# Patient Record
Sex: Female | Born: 2000 | Hispanic: Yes | Marital: Single | Smoking: Never smoker
Health system: Southern US, Community
[De-identification: ages and names within clinical notes are randomized; demographics above are authoritative.]

## PROBLEM LIST (undated history)

## (undated) DIAGNOSIS — Q211 Atrial septal defect, unspecified: Secondary | ICD-10-CM

## (undated) DIAGNOSIS — R011 Cardiac murmur, unspecified: Secondary | ICD-10-CM

## (undated) HISTORY — PX: OTHER SURGICAL HISTORY: SHX169

## (undated) HISTORY — DX: Cardiac murmur, unspecified: R01.1

## (undated) HISTORY — PX: ATRIAL SEPTAL DEFECT(ASD) CLOSURE: CATH118299

---
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
## 2019-01-15 ENCOUNTER — Ambulatory Visit (INDEPENDENT_AMBULATORY_CARE_PROVIDER_SITE_OTHER): Payer: Medicaid Other | Admitting: *Deleted

## 2019-01-15 ENCOUNTER — Other Ambulatory Visit: Payer: Self-pay

## 2019-01-15 DIAGNOSIS — Z3042 Encounter for surveillance of injectable contraceptive: Secondary | ICD-10-CM

## 2019-01-15 MED ORDER — MEDROXYPROGESTERONE ACETATE 150 MG/ML IM SUSY
150.0000 mg | PREFILLED_SYRINGE | Freq: Once | INTRAMUSCULAR | Status: AC
  Administered 2019-01-15: 150 mg via INTRAMUSCULAR

## 2019-01-15 NOTE — Progress Notes (Signed)
Patient here today for Depo Provera injection.  Depo given today LUOQ.  Site unremarkable & patient tolerated injection.  Next injection due 04/02/19 - 04/16/19.  Last contraceptive appt: 04/2018  Reminder card given.  Jone Baseman, CMA

## 2019-04-02 ENCOUNTER — Other Ambulatory Visit: Payer: Self-pay

## 2019-04-02 ENCOUNTER — Ambulatory Visit (INDEPENDENT_AMBULATORY_CARE_PROVIDER_SITE_OTHER): Payer: Medicaid Other

## 2019-04-02 DIAGNOSIS — Z30019 Encounter for initial prescription of contraceptives, unspecified: Secondary | ICD-10-CM

## 2019-04-02 MED ORDER — MEDROXYPROGESTERONE ACETATE 150 MG/ML IM SUSP
150.0000 mg | Freq: Once | INTRAMUSCULAR | Status: AC
  Administered 2019-04-02: 150 mg via INTRAMUSCULAR

## 2019-04-02 NOTE — Progress Notes (Signed)
Patient presents in nurse clinic for depo provera injection. Patient is within dates. Depo provera injection given RUOQ, site unremarkable. Next depo due 8/27-9/10, reminder card given.

## 2019-06-03 ENCOUNTER — Other Ambulatory Visit: Payer: Self-pay

## 2019-06-03 MED ORDER — FERROUS SULFATE 325 (65 FE) MG PO TABS: 325.0000 mg | ORAL_TABLET | ORAL | 2 refills | Status: DC

## 2019-07-02 ENCOUNTER — Other Ambulatory Visit: Payer: Self-pay

## 2019-07-02 ENCOUNTER — Ambulatory Visit: Payer: Medicaid Other

## 2019-07-02 DIAGNOSIS — Z3042 Encounter for surveillance of injectable contraceptive: Secondary | ICD-10-CM

## 2019-07-02 MED ORDER — MEDROXYPROGESTERONE ACETATE 150 MG/ML IM SUSP
150.0000 mg | Freq: Once | INTRAMUSCULAR | Status: AC
  Administered 2021-01-23: 150 mg via INTRAMUSCULAR

## 2019-07-02 NOTE — Progress Notes (Signed)
Patient here today for Depo Provera injection and is within her dates.    Last contraceptive appt was 04/02/2019.   Depo given in LOQ today.  Site unremarkable & patient tolerated injection.    Next injection due Nov 26-Dec10.  Reminder card given.    Ottis Stain, CMA

## 2019-09-23 ENCOUNTER — Other Ambulatory Visit: Payer: Self-pay

## 2019-09-23 ENCOUNTER — Ambulatory Visit (INDEPENDENT_AMBULATORY_CARE_PROVIDER_SITE_OTHER): Payer: Medicaid Other | Admitting: *Deleted

## 2019-09-23 DIAGNOSIS — Z3042 Encounter for surveillance of injectable contraceptive: Secondary | ICD-10-CM | POA: Diagnosis not present

## 2019-09-24 DIAGNOSIS — Z3042 Encounter for surveillance of injectable contraceptive: Secondary | ICD-10-CM

## 2019-09-24 MED ORDER — MEDROXYPROGESTERONE ACETATE 150 MG/ML IM SUSY
150.0000 mg | PREFILLED_SYRINGE | Freq: Once | INTRAMUSCULAR | Status: AC
  Administered 2019-09-24: 150 mg via INTRAMUSCULAR

## 2019-09-24 NOTE — Progress Notes (Signed)
Patient here today for Depo Provera injection and is within her dates.    Last contraceptive appt was 05/14/18.  She was informed that next appt must be with a provider, she is agreeable to plan.  Depo given in [place] today.  Site unremarkable & patient tolerated injection.    Next injection due Feb 17 - December 23, 2019, she will make an appt with the provider within those dates.  Reminder card given.    Christen Bame, CMA

## 2019-12-09 ENCOUNTER — Other Ambulatory Visit: Payer: Self-pay

## 2019-12-09 ENCOUNTER — Ambulatory Visit (INDEPENDENT_AMBULATORY_CARE_PROVIDER_SITE_OTHER): Payer: Medicaid Other | Admitting: Family Medicine

## 2019-12-09 ENCOUNTER — Encounter: Payer: Self-pay | Admitting: Family Medicine

## 2019-12-09 ENCOUNTER — Ambulatory Visit: Payer: Medicaid Other

## 2019-12-09 VITALS — BP 116/64 | HR 88 | Wt 126.0 lb

## 2019-12-09 DIAGNOSIS — Z3042 Encounter for surveillance of injectable contraceptive: Secondary | ICD-10-CM

## 2019-12-09 MED ORDER — MEDROXYPROGESTERONE ACETATE 150 MG/ML IM SUSY
150.0000 mg | PREFILLED_SYRINGE | INTRAMUSCULAR | Status: AC
  Administered 2019-12-09 – 2020-03-02 (×2): 150 mg via INTRAMUSCULAR

## 2019-12-11 NOTE — Progress Notes (Signed)
   CHIEF COMPLAINT / HPI:  Due for Depo shot  PERTINENT  PMH / PSH: Patient does have history of atrial septal defect closure but has been doing well since.  Has been regular with her Depo shots and is within the timeframe for her next dose.  She wishes to continue with this as her contraception plan, says she is safe at home and no one is making her do anything that she does not want to do.  She denies need for testing for STI and has no physical complaints at this time.  Says she does not smoke or drink and has good exercise tolerance, constantly states she could defeat me in a race up 6 flights of stairs in the hospital.   OBJECTIVE: BP 116/64   Pulse 88   Wt 126 lb (57.2 kg)   SpO2 100%   BMI 21.63 kg/m   Physical Exam  Constitutional: She appears well-developed and well-nourished. No distress.  HENT:  Head: Normocephalic.  Eyes: Conjunctivae are normal. Right eye exhibits no discharge. Left eye exhibits no discharge.  Cardiovascular: Normal rate.  Respiratory: Effort normal. No stridor. No respiratory distress.  Genitourinary:    Genitourinary Comments: No indication for sensitive exam at this visit   Neurological: She is alert.  Skin: Skin is warm and dry. She is not diaphoretic.  Psychiatric: She has a normal mood and affect. Her behavior is normal. Judgment and thought content normal.     ASSESSMENT / PLAN:  Encounter for Depo-Provera contraception Patient within timeframe for next dose of Depo, no concerns at this point.  Can continue plan.     Marthenia Rolling, DO [facility]

## 2019-12-11 NOTE — Assessment & Plan Note (Signed)
Patient within timeframe for next dose of Depo, no concerns at this point.  Can continue plan.

## 2020-03-02 ENCOUNTER — Other Ambulatory Visit: Payer: Self-pay

## 2020-03-02 ENCOUNTER — Ambulatory Visit (INDEPENDENT_AMBULATORY_CARE_PROVIDER_SITE_OTHER): Payer: Medicaid Other

## 2020-03-02 DIAGNOSIS — Z3042 Encounter for surveillance of injectable contraceptive: Secondary | ICD-10-CM

## 2020-03-02 NOTE — Progress Notes (Signed)
Patient here today for Depo Provera injection and is within her dates.    Last contraceptive appt was 12/09/2019  Depo given in RUOQ today.  Site unremarkable & patient tolerated injection.    Next injection due 7/28-8/11/21.  Reminder card given.    Veronda Prude, RN

## 2020-03-03 MED ORDER — FERROUS SULFATE 325 (65 FE) MG PO TABS: 325.0000 mg | ORAL_TABLET | ORAL | 2 refills | Status: AC

## 2020-05-16 ENCOUNTER — Ambulatory Visit: Payer: Medicaid Other | Admitting: Family Medicine

## 2020-05-17 ENCOUNTER — Other Ambulatory Visit: Payer: Self-pay

## 2020-05-17 ENCOUNTER — Encounter: Payer: Self-pay | Admitting: Family Medicine

## 2020-05-17 ENCOUNTER — Ambulatory Visit (INDEPENDENT_AMBULATORY_CARE_PROVIDER_SITE_OTHER): Payer: Medicaid Other | Admitting: Family Medicine

## 2020-05-17 VITALS — BP 100/62 | HR 84 | Wt 125.4 lb

## 2020-05-17 DIAGNOSIS — D509 Iron deficiency anemia, unspecified: Secondary | ICD-10-CM | POA: Diagnosis not present

## 2020-05-17 DIAGNOSIS — Z9889 Other specified postprocedural states: Secondary | ICD-10-CM | POA: Diagnosis not present

## 2020-05-17 DIAGNOSIS — D508 Other iron deficiency anemias: Secondary | ICD-10-CM

## 2020-05-17 NOTE — Assessment & Plan Note (Signed)
-   Obtain CBC and Ferritin levels

## 2020-05-17 NOTE — Progress Notes (Signed)
    SUBJECTIVE:   CHIEF COMPLAINT / HPI: Labs  Cynthia Riley is a 19 year old female presenting for Depo shot and Labs.  Was informed by nurse that se would have to wait until tomorrow for Depo shot.  Indicates she still wished to obtain labs today as she was already here.  Has previous history of iron deficiency anemia.  Denies any heavy menstruation, fatigue, weakness or presyncope.  Currently takes Iron supplement every other day.  Also had Surgical closure of patent atrial septum in 2017.    PERTINENT  PMH / PSH: Iron deficiency Anemia  OBJECTIVE:   BP (!) 100/62   Pulse 84   Wt 125 lb 6 oz (56.9 kg)   SpO2 99%   BMI 21.52 kg/m    Physical Exam Constitutional:      General: She is not in acute distress.    Appearance: Normal appearance.  HENT:     Head: Normocephalic and atraumatic.  Cardiovascular:     Rate and Rhythm: Normal rate and regular rhythm.  Pulmonary:     Effort: Pulmonary effort is normal.     Breath sounds: Normal breath sounds.  Neurological:     Mental Status: She is alert.     ASSESSMENT/PLAN:   Iron deficiency anemia - Obtain CBC and Ferritin levels    Jovita Kussmaul, MD [facility]

## 2020-05-17 NOTE — Patient Instructions (Signed)
It was good to see you today.  Thank you for coming in.     Today we obtained a CBC and Ferritin given previous anemia and heart surgery.   Be Well. Dr Drue Flirt

## 2020-05-18 ENCOUNTER — Other Ambulatory Visit: Payer: Self-pay

## 2020-05-18 ENCOUNTER — Ambulatory Visit (INDEPENDENT_AMBULATORY_CARE_PROVIDER_SITE_OTHER): Payer: Medicaid Other

## 2020-05-18 DIAGNOSIS — Z3042 Encounter for surveillance of injectable contraceptive: Secondary | ICD-10-CM

## 2020-05-18 LAB — CBC WITH DIFFERENTIAL/PLATELET
Basophils Absolute: 0 10*3/uL (ref 0.0–0.2)
Basos: 0 %
EOS (ABSOLUTE): 0.1 10*3/uL (ref 0.0–0.4)
Eos: 2 %
Hematocrit: 40.2 % (ref 34.0–46.6)
Hemoglobin: 13.4 g/dL (ref 11.1–15.9)
Immature Grans (Abs): 0 10*3/uL (ref 0.0–0.1)
Immature Granulocytes: 0 %
Lymphocytes Absolute: 2 10*3/uL (ref 0.7–3.1)
Lymphs: 30 %
MCH: 28.6 pg (ref 26.6–33.0)
MCHC: 33.3 g/dL (ref 31.5–35.7)
MCV: 86 fL (ref 79–97)
Monocytes Absolute: 0.5 10*3/uL (ref 0.1–0.9)
Monocytes: 7 %
Neutrophils Absolute: 4 10*3/uL (ref 1.4–7.0)
Neutrophils: 61 %
Platelets: 258 10*3/uL (ref 150–450)
RBC: 4.68 x10E6/uL (ref 3.77–5.28)
RDW: 12.4 % (ref 11.7–15.4)
WBC: 6.6 10*3/uL (ref 3.4–10.8)

## 2020-05-18 LAB — FERRITIN: Ferritin: 20 ng/mL (ref 15–150)

## 2020-05-18 MED ORDER — MEDROXYPROGESTERONE ACETATE 150 MG/ML IM SUSP
150.0000 mg | Freq: Once | INTRAMUSCULAR | Status: AC
  Administered 2020-05-18: 150 mg via INTRAMUSCULAR

## 2020-05-18 NOTE — Progress Notes (Signed)
Patient here today for Depo Provera injection and is within her dates.    Last contraceptive appt was 12/09/2019.  Depo given in LUOQ today. Site unremarkable & patient tolerated injection.    Next injection due 08/03/2020-08/17/2020. Reminder card given.

## 2020-08-15 ENCOUNTER — Ambulatory Visit: Payer: Medicaid Other

## 2020-08-15 ENCOUNTER — Other Ambulatory Visit: Payer: Self-pay

## 2020-11-04 ENCOUNTER — Other Ambulatory Visit: Payer: Self-pay

## 2020-11-04 ENCOUNTER — Ambulatory Visit (INDEPENDENT_AMBULATORY_CARE_PROVIDER_SITE_OTHER): Payer: Medicaid Other

## 2020-11-04 DIAGNOSIS — Z3042 Encounter for surveillance of injectable contraceptive: Secondary | ICD-10-CM | POA: Diagnosis not present

## 2020-11-04 LAB — POCT URINE PREGNANCY: Preg Test, Ur: NEGATIVE

## 2020-11-04 MED ORDER — MEDROXYPROGESTERONE ACETATE 150 MG/ML IM SUSP
150.0000 mg | Freq: Once | INTRAMUSCULAR | Status: AC
  Administered 2020-11-04: 150 mg via INTRAMUSCULAR

## 2020-11-04 NOTE — Progress Notes (Signed)
Patient here today for Depo Provera injection and is not within her dates.   Pregnancy test obtained and negative.  Last contraceptive appt was2/17/2021.  Apt scheduled for 12/02/2020 with PCP.  Depo given inLUOQtoday. Site unremarkable &patient tolerated injection.   Next injection due04/10/2020-02/03/2021. Reminder card given.

## 2020-12-02 ENCOUNTER — Ambulatory Visit: Payer: Medicaid Other | Admitting: Family Medicine

## 2020-12-02 NOTE — Progress Notes (Deleted)
     SUBJECTIVE:   CHIEF COMPLAINT / HPI:   Cynthia Riley Cynthia Riley is a 20 y.o. female presents to discuss birth control  Birth control Pt is on depo since ****. Sexually active with ****  ***  PERTINENT  PMH / PSH:   OBJECTIVE:   There were no vitals taken for this visit.   General: Alert, no acute distress Cardio: Normal S1 and S2, RRR, no r/m/g Pulm: CTAB, normal work of breathing Abdomen: Bowel sounds normal. Abdomen soft and non-tender.  Extremities: No peripheral edema.  Neuro: Cranial nerves grossly intact   ASSESSMENT/PLAN:   No problem-specific Assessment & Plan notes found for this encounter.     Towanda Octave, MD PGY-2 [facility]

## 2021-01-23 ENCOUNTER — Other Ambulatory Visit: Payer: Self-pay

## 2021-01-23 ENCOUNTER — Ambulatory Visit (INDEPENDENT_AMBULATORY_CARE_PROVIDER_SITE_OTHER): Payer: Medicaid Other

## 2021-01-23 DIAGNOSIS — Z3042 Encounter for surveillance of injectable contraceptive: Secondary | ICD-10-CM

## 2021-01-23 NOTE — Progress Notes (Signed)
Patient here today for Depo Provera injection and is within her dates.    Last contraceptive appt was 12/09/2019. Informed patient that she would need to schedule next depo with provider for yearly contraceptive appointment. Advised patient that we would not be able to administer depo again until she has appointment with provider. Patient verbalizes understanding.   Depo given in RUOQ today.  Site unremarkable & patient tolerated injection.    Next injection due 04/10/21-04/24/21.  Reminder card given.    Veronda Prude, RN

## 2021-03-23 ENCOUNTER — Emergency Department (HOSPITAL_BASED_OUTPATIENT_CLINIC_OR_DEPARTMENT_OTHER): Payer: Medicaid Other

## 2021-03-23 ENCOUNTER — Emergency Department (HOSPITAL_BASED_OUTPATIENT_CLINIC_OR_DEPARTMENT_OTHER)
Admission: EM | Admit: 2021-03-23 | Discharge: 2021-03-23 | Disposition: A | Discharge status: Home / Self Care | Payer: Medicaid Other | Attending: Emergency Medicine | Admitting: Emergency Medicine

## 2021-03-23 ENCOUNTER — Encounter (HOSPITAL_BASED_OUTPATIENT_CLINIC_OR_DEPARTMENT_OTHER): Payer: Self-pay | Admitting: *Deleted

## 2021-03-23 ENCOUNTER — Other Ambulatory Visit: Payer: Self-pay

## 2021-03-23 DIAGNOSIS — F430 Acute stress reaction: Secondary | ICD-10-CM | POA: Diagnosis not present

## 2021-03-23 DIAGNOSIS — R079 Chest pain, unspecified: Secondary | ICD-10-CM | POA: Diagnosis present

## 2021-03-23 DIAGNOSIS — R010 Benign and innocent cardiac murmurs: Secondary | ICD-10-CM | POA: Insufficient documentation

## 2021-03-23 DIAGNOSIS — R0789 Other chest pain: Secondary | ICD-10-CM | POA: Insufficient documentation

## 2021-03-23 DIAGNOSIS — F411 Generalized anxiety disorder: Secondary | ICD-10-CM

## 2021-03-23 DIAGNOSIS — R002 Palpitations: Secondary | ICD-10-CM

## 2021-03-23 DIAGNOSIS — R0602 Shortness of breath: Secondary | ICD-10-CM | POA: Insufficient documentation

## 2021-03-23 DIAGNOSIS — R000 Tachycardia, unspecified: Secondary | ICD-10-CM | POA: Diagnosis not present

## 2021-03-23 DIAGNOSIS — F419 Anxiety disorder, unspecified: Secondary | ICD-10-CM | POA: Diagnosis not present

## 2021-03-23 DIAGNOSIS — R072 Precordial pain: Secondary | ICD-10-CM | POA: Diagnosis not present

## 2021-03-23 HISTORY — DX: Atrial septal defect: Q21.1

## 2021-03-23 HISTORY — DX: Atrial septal defect, unspecified: Q21.10

## 2021-03-23 MED ORDER — LORAZEPAM 1 MG PO TABS
1.0000 mg | ORAL_TABLET | Freq: Once | ORAL | Status: AC
  Administered 2021-03-23: 1 mg via ORAL
  Filled 2021-03-23: qty 1

## 2021-03-23 MED ORDER — LORAZEPAM 1 MG PO TABS: 1.0000 mg | ORAL_TABLET | Freq: Three times a day (TID) | ORAL | 0 refills | Status: AC | PRN

## 2021-03-23 NOTE — Discharge Instructions (Signed)
Your work-up today is overall reassuring and I suspect her symptoms related to the anxiety and stress of your family's loss today.  Your work-up in regards to EKG and chest x-ray are reassuring although you did not want to get laboratory testing which we typically would collect.  Please follow-up with a primary doctor to discuss further management of your stress and anxiety as well as strict return precautions for any new or worsening chest discomfort.  If any symptoms change, please return.

## 2021-03-23 NOTE — ED Provider Notes (Signed)
[facility] EMERGENCY DEPARTMENT Provider Note   CSN: [id number] Arrival date & time: 03/23/21  1914     History Chief Complaint  Patient presents with  . Chest Pain    Julio Storr Hincapie [name] is a 20 y.o. female.  The history is provided by the patient and medical records. No language interpreter was used.  Chest Pain Pain location:  L chest Pain quality: sharp   Pain radiates to:  Does not radiate Pain severity:  Moderate Onset quality:  Sudden Duration:  2 hours Timing:  Constant Progression:  Waxing and waning Chronicity:  New Context: stress   Relieved by:  Nothing Worsened by:  Nothing (palpation) Ineffective treatments:  None tried Associated symptoms: anxiety, palpitations and shortness of breath   Associated symptoms: no abdominal pain, no altered mental status, no back pain, no claudication, no cough, no diaphoresis, no dizziness, no fatigue, no fever, no headache, no lower extremity edema, no nausea, no numbness, no vomiting and no weakness   Risk factors: no hypertension and not female        Past Medical History:  Diagnosis Date  . Atrial septal defect   . Heart murmur     Patient Active Problem List   Diagnosis Date Noted  . Iron deficiency anemia 05/17/2020  . S/P surgical atrial septal defect closure 05/17/2020  . Heart murmur 02/14/2015  . Allergic conjunctivitis and rhinitis 02/14/2015  . Encounter for Depo-Provera contraception 02/14/2015    Past Surgical History:  Procedure Laterality Date  . ATRIAL SEPTAL DEFECT(ASD) CLOSURE    . heart sugery  04/2015  . None       OB History   No obstetric history on file.     Family History  Problem Relation Age of Onset  . Healthy Mother     Social History   Tobacco Use  . Smoking status: Never Smoker  . Smokeless tobacco: Never Used  Substance Use Topics  . Alcohol use: No    Alcohol/week: 0.0 standard drinks  . Drug use: No    Home Medications Prior to  Admission medications   Medication Sig Start Date End Date Taking? Authorizing Provider  ferrous sulfate 325 (65 FE) MG tablet Take 1 tablet (325 mg total) by mouth every other day. 03/03/20   Marthenia Rolling, DO  triamcinolone cream (KENALOG) 0.1 % Apply 1 application topically 2 (two) times daily. 12/03/18   Wieters, Hallie C, PA-C    Allergies    Patient has no known allergies.  Review of Systems   Review of Systems  Constitutional: Negative for chills, diaphoresis, fatigue and fever.  HENT: Negative for congestion.   Respiratory: Positive for shortness of breath. Negative for cough, chest tightness and wheezing.   Cardiovascular: Positive for chest pain and palpitations. Negative for claudication and leg swelling.  Gastrointestinal: Negative for abdominal pain, constipation, diarrhea, nausea and vomiting.  Genitourinary: Negative for dysuria, flank pain and frequency.  Musculoskeletal: Negative for back pain, neck pain and neck stiffness.  Skin: Negative for rash and wound.  Neurological: Negative for dizziness, weakness, light-headedness, numbness and headaches.  Psychiatric/Behavioral: Negative for agitation and confusion.  All other systems reviewed and are negative.   Physical Exam Updated Vital Signs BP 133/69 (BP Location: Right Arm)   Pulse (!) 112   Temp 99 F (37.2 C) (Oral)   Resp 20   Ht 5\' 1"  (1.549 m)   Wt 59 kg   LMP 03/21/2021   SpO2 96%   BMI 24.56  kg/m   Physical Exam Vitals and nursing note reviewed.  Constitutional:      General: She is not in acute distress.    Appearance: She is well-developed. She is not ill-appearing, toxic-appearing or diaphoretic.  HENT:     Head: Normocephalic and atraumatic.  Eyes:     Conjunctiva/sclera: Conjunctivae normal.  Cardiovascular:     Rate and Rhythm: Regular rhythm. Tachycardia present.     Heart sounds: Murmur heard.    Pulmonary:     Effort: Pulmonary effort is normal. No respiratory distress.     Breath  sounds: Normal breath sounds. No decreased breath sounds, wheezing or rales.  Chest:     Chest wall: Tenderness present.    Abdominal:     Palpations: Abdomen is soft.     Tenderness: There is no abdominal tenderness.  Musculoskeletal:     Cervical back: Neck supple.     Right lower leg: No tenderness. No edema.     Left lower leg: No tenderness. No edema.  Skin:    General: Skin is warm and dry.     Capillary Refill: Capillary refill takes less than 2 seconds.     Findings: No erythema.  Neurological:     General: No focal deficit present.     Mental Status: She is alert.     ED Results / Procedures / Treatments   Labs (all labs ordered are listed, but only abnormal results are displayed) Labs Reviewed - No data to display  EKG EKG Interpretation  Date/Time:  Thursday March 23 2021 19:29:31 EDT Ventricular Rate:  97 PR Interval:  128 QRS Duration: 88 QT Interval:  334 QTC Calculation: 424 R Axis:   97 Text Interpretation: Normal sinus rhythm Rightward axis Borderline ECG No prior ECG for comparison. No STEMI Confirmed by Theda Belfast ([id number]) on 03/23/2021 7:55:55 PM   Radiology DG Chest 2 View  Result Date: 03/23/2021 CLINICAL DATA:  Chest, palpitations EXAM: CHEST - 2 VIEW COMPARISON:  None. FINDINGS: Prior median sternotomy. Heart and mediastinal contours are within normal limits. No focal opacities or effusions. No acute bony abnormality. IMPRESSION: No active cardiopulmonary disease. Electronically Signed   By: Charlett Nose M.D.   On: 03/23/2021 20:53    Procedures Procedures   Medications Ordered in ED Medications  LORazepam (ATIVAN) tablet 1 mg (1 mg Oral Given 03/23/21 2028)    ED Course  I have reviewed the triage vital signs and the nursing notes.  Pertinent labs & imaging results that were available during my care of the patient were reviewed by me and considered in my medical decision making (see chart for details).    MDM Rules/Calculators/A&P                           Orion Modest Hincapie [name] is a 20 y.o. 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  According to patient, she found out that her grandmother died several hours ago and has been having some chest discomfort and palpitations since.  She reports a mild shortness of breath that has resolved.  She reports no nausea, vomiting, lightheadedness, or syncope.  Denies any trauma.  She reports some mild palpitations and some chest discomfort that is sharp.  Is not pleuritic or exertional.  She wants to make sure her heart and  lungs are okay but thinks it is likely just the stress of hearing the news about her grandmother passing.  She reports the pain is a 5 out of 10 in severity and improving.  Denies other complaints.  Denies any preceding symptoms whatsoever before the news was delivered and denies any recent leg pain, leg swelling, long travel, history of DVT/PE, fevers, chills, cough, or other complaints.  EKG shows no STEMI.  On exam, chest is tender to palpation and her discomfort is reproducible.  Lungs clear.  Systolic murmur present.  Abdomen nontender.  Back nontender.  Exam otherwise unremarkable.  Had a shared decision conversation with patient as I suspect her symptoms are due to the anxiety and stress of the new she received today of her grandmother's passing.  We offered labs including delta troponin and as her heart rate was slightly fast on arrival, a D-dimer.  Patient does not want any blood work at this time and understands the risks of missing a cardiac cause.  Patient is agreeable to getting least chest x-ray and getting a dose of Ativan as I suspect her stress and anxiety is contributing to her discomfort.  If x-ray is reassuring, patient was given scription for several doses of Ativan and will call her  PCP to follow-up for further management of her symptoms.  Low suspicion for cardiac cause at this time and given lack of lab tests that she will let us do, suspect she will be stable for discharge home.  9:29 PM X-ray is reassuring.  Patient reports her chest pain has resolved and now that the Ativan is help with her anxiety and stress.  She is feeling much better now.  She would like to go home.  She still agrees with not getting any blood work today and we discussed all this could be missing a cardiac cause of her symptoms but she still wants to go home.  She will follow-up with her primary doctor to discuss further stress and anxiety management as well as reassessment.  She had no other questions or concerns and was discharged in good condition with resolution of chest pain.  Final Clinical Impression(s) / ED Diagnoses Final diagnoses:  Anxiety in acute stress reaction  Atypical chest pain  Palpitations    Rx / DC Orders ED Discharge Orders         Ordered    LORazepam (ATIVAN) 1 MG tablet  3 times daily PRN        03/23/21 2131         Clinical Impression: 1. Anxiety in acute stress reaction   2. Atypical chest pain   3. Palpitations     Disposition: Discharge  Condition: Good  I have discussed the results, Dx and Tx plan with the pt(& family if present). He/she/they expressed understanding and agree(s) with the plan. Discharge instructions discussed at great length. Strict return precautions discussed and pt &/or family have verbalized understanding of the instructions. No further questions at time of discharge.    New Prescriptions   LORAZEPAM (ATIVAN) 1 MG TABLET    Take 1 tablet (1 mg total) by mouth 3 (three) times daily as needed for anxiety.    [facility] [address] [place] [id number] [phone] Schedule an appointment as soon as possible for a visit    Wanda Plumpaz, Jose E, MD [address] [place] [id number] [phone]     [facility] EMERGENCY DEPARTMENT [address] [id number] mc [address] [place] [address] [phone]       Zakari Bathe, Canary Brim, MD 03/23/21 2132

## 2021-03-23 NOTE — ED Triage Notes (Addendum)
C/o left chest pain and palpatations x 2 hrs after receiving " bad news"

## 2021-03-23 NOTE — ED Notes (Signed)
Pt hooked up to the monitor with the 5 lead, BP cuff and pulse ox ?

## 2021-03-24 ENCOUNTER — Telehealth: Payer: Self-pay | Admitting: *Deleted

## 2021-03-24 NOTE — Telephone Encounter (Signed)
Transition Care Management Unsuccessful Follow-up Telephone Call  Date of discharge and from where:  03/23/2021 - [facility]  Attempts:  1st Attempt  Reason for unsuccessful TCM follow-up call:  Left voice message

## 2021-03-27 NOTE — Telephone Encounter (Signed)
Transition Care Management Follow-up Telephone Call  Date of discharge and from where: 03/23/2021 - [facility]  How have you been since you were released from the hospital? "Doing fine"  Any questions or concerns? No  Items Reviewed:  Did the pt receive and understand the discharge instructions provided? Yes   Medications obtained and verified? Yes   Other? No   Any new allergies since your discharge? No   Dietary orders reviewed? No  Do you have support at home? Yes    Functional Questionnaire: (I = Independent and D = Dependent) ADLs: I  Bathing/Dressing- I  Meal Prep- I  Eating- I  Maintaining continence- I  Transferring/Ambulation- I  Managing Meds- I  Follow up appointments reviewed:   PCP Hospital f/u appt confirmed? No    Specialist Hospital f/u appt confirmed? No    Are transportation arrangements needed? No   If their condition worsens, is the pt aware to call PCP or go to the Emergency Dept.? Yes  Was the patient provided with contact information for the PCP's office or ED? Yes  Was to pt encouraged to call back with questions or concerns? Yes

## 2021-04-11 ENCOUNTER — Ambulatory Visit: Payer: Medicaid Other | Admitting: Family Medicine

## 2021-04-11 NOTE — Progress Notes (Deleted)
     SUBJECTIVE:   CHIEF COMPLAINT / HPI:   Cynthia Riley is a 20 y.o. female presents for depo follow up    ***  Flowsheet Row Office Visit from 05/17/2020 in [facility]  PHQ-9 Total Score 6        Health Maintenance Due  Topic   HPV VACCINES (3 - 2-dose series)   Hepatitis C Screening    COVID-19 Vaccine (3 - Booster for Pfizer series)      PERTINENT  PMH / PSH:   OBJECTIVE:   LMP 03/21/2021    General: Alert, no acute distress Cardio: Normal S1 and S2, RRR, no r/m/g Pulm: CTAB, normal work of breathing Abdomen: Bowel sounds normal. Abdomen soft and non-tender.  Extremities: No peripheral edema.  Neuro: Cranial nerves grossly intact   ASSESSMENT/PLAN:   No problem-specific Assessment & Plan notes found for this encounter.     Towanda Octave, MD PGY-2 [facility]

## 2021-07-04 DIAGNOSIS — Z9889 Other specified postprocedural states: Secondary | ICD-10-CM | POA: Diagnosis not present

## 2021-11-17 ENCOUNTER — Ambulatory Visit (INDEPENDENT_AMBULATORY_CARE_PROVIDER_SITE_OTHER): Payer: Medicaid Other | Admitting: Family Medicine

## 2021-11-17 ENCOUNTER — Encounter: Payer: Self-pay | Admitting: Family Medicine

## 2021-11-17 ENCOUNTER — Other Ambulatory Visit: Payer: Self-pay

## 2021-11-17 VITALS — BP 110/62 | HR 64 | Ht 64.0 in | Wt 150.2 lb

## 2021-11-17 DIAGNOSIS — Z3169 Encounter for other general counseling and advice on procreation: Secondary | ICD-10-CM

## 2021-11-17 DIAGNOSIS — N946 Dysmenorrhea, unspecified: Secondary | ICD-10-CM | POA: Diagnosis not present

## 2021-11-17 MED ORDER — NAPROXEN 500 MG PO TABS: 500.0000 mg | ORAL_TABLET | Freq: Two times a day (BID) | ORAL | 0 refills | Status: AC

## 2021-11-17 NOTE — Progress Notes (Signed)
° ° ° °  SUBJECTIVE:   CHIEF COMPLAINT / HPI:   Cynthia Riley Cynthia Riley is a 21 y.o. female presents for follow up  Dysmenorrhea  Pt reports painful periods after stopping depo last April. Ibuprofen helps a little. LMP: 26th Jan. No hx of fibroids, endometriosis, adenomyosis. Age of menarche: 39 Menstrual pattern: every month. Changes pads every 3 hrs for first 2 days, then using tampons. Feels her cycles are heavy  Change in menstrual pattern: no  If change, events around change (weight loss, stress, intensive exercise): no  Pain with periods: yes   Sexual Activity: yes  LMP:  26th Jan  Contraception: none  Hx of STIs: no No Hx DM, liver disease, renal disease, SLE  No use of anticoagulation, antidepressants, or antipsychotics  No history of bleeding gums, easy bruising, bleeding disorder  No dizziness upon standing  changes in weight yes-20-25 lb weight gain No abdominal pain, fever, vaginal discharge  No dyspareunia No galactorrhea, heat or col intolerance, visual changes, headaches, hirsutism/acne  No changes in bowel or bladder function  No history of hypermobility  No changes in hair, skin, or nails   No family history of bleeding disorders, menstrual problems, DM, thyroid disorders, cancers Stressors: none  Prenatal counseling  Pt desires a pregnancy. She is having unprotected intercourse with partner 3 times a week. She has no other children. She is monitoring her periods and ovulation on app. She is also using ovulation sticks. She does not smoker,  drink ETOH or do drugs etc. Not on prenatals.   Flowsheet Row Office Visit from 11/17/2021 in [facility]  PHQ-9 Total Score 5      PERTINENT  PMH / PSH: iron deficiency   OBJECTIVE:   BP 110/62    Pulse 64    Ht 5\' 4"  (1.626 m)    Wt 150 lb 4 oz (68.2 kg)    LMP 11/16/2021    SpO2 98%    BMI 25.79 kg/m    General: Alert, no acute distress Cardio:  well perfused Pulm: normal work of  breathing Neuro: Cranial nerves grossly intact   ASSESSMENT/PLAN:   Encounter for preconception consultation Pt desires a pregnancy and has been TTC for 3 months. Recommended she starts taking prenatal vitamins. She has regular cycles which is reassuring, ovulating and tracking her cycles and ovulation on an app. Overall her lifestyle is healthy. Continue intercourse at least 3 times a week. Reassured pt that it can take up to 6 months to conceive if not a little longer. Follow up in a few months to check in.   Dysmenorrhea Likely primary dysmenorrhea. Ordered TV 11/18/2021. Prescribed Naproxen 500mg  BID for the pain during cycles.     [place], MD PGY-3 [facility]

## 2021-11-17 NOTE — Patient Instructions (Signed)
Thank you for coming to see me today. It was a pleasure. Today we discussed your painful periods. I recommend getting pelvic Ultrasound. Take naproxen for pain.  Continue tracking cycles and ovulation. Have sex 3 times a week. Take prenatals daily      Please follow-up with me for physical   If you have any questions or concerns, please do not hesitate to call the office at [phone].  Best wishes,   Dr Posey Pronto

## 2021-11-18 DIAGNOSIS — N946 Dysmenorrhea, unspecified: Secondary | ICD-10-CM | POA: Insufficient documentation

## 2021-11-18 NOTE — Assessment & Plan Note (Addendum)
Pt desires a pregnancy and has been TTC for 3 months. Recommended she starts taking prenatal vitamins. She has regular cycles which is reassuring, ovulating and tracking her cycles and ovulation on an app. Overall her lifestyle is healthy. Continue intercourse at least 3 times a week. Reassured pt that it can take up to 6 months to conceive if not a little longer. Follow up in a few months to check in.

## 2021-11-18 NOTE — Assessment & Plan Note (Signed)
Likely primary dysmenorrhea. Ordered TV US. Prescribed Naproxen 500mg  BID for the pain during cycles.

## 2021-12-07 ENCOUNTER — Other Ambulatory Visit: Payer: Self-pay | Admitting: Family Medicine

## 2021-12-07 DIAGNOSIS — R5383 Other fatigue: Secondary | ICD-10-CM

## 2021-12-07 DIAGNOSIS — D509 Iron deficiency anemia, unspecified: Secondary | ICD-10-CM

## 2021-12-07 DIAGNOSIS — Z131 Encounter for screening for diabetes mellitus: Secondary | ICD-10-CM

## 2021-12-15 ENCOUNTER — Other Ambulatory Visit: Payer: Self-pay

## 2021-12-15 ENCOUNTER — Other Ambulatory Visit: Payer: Medicaid Other

## 2021-12-15 DIAGNOSIS — R5383 Other fatigue: Secondary | ICD-10-CM

## 2021-12-15 DIAGNOSIS — D509 Iron deficiency anemia, unspecified: Secondary | ICD-10-CM

## 2022-01-01 NOTE — Progress Notes (Deleted)
? ? ?  SUBJECTIVE:  ? ?CHIEF COMPLAINT / HPI:  ? ?*** ? ?PERTINENT  PMH / PSH: heart murmur, IDA, dysmennorhea ? ?OBJECTIVE:  ? ?There were no vitals taken for this visit.  ?*** ? ?ASSESSMENT/PLAN:  ? ?No problem-specific Assessment & Plan notes found for this encounter. ?  ? ? ?Levin Erp, MD ?[facility]  ?

## 2022-01-02 ENCOUNTER — Ambulatory Visit: Payer: Medicaid Other | Admitting: Student

## 2022-01-09 ENCOUNTER — Ambulatory Visit (INDEPENDENT_AMBULATORY_CARE_PROVIDER_SITE_OTHER): Payer: Medicaid Other | Admitting: Family Medicine

## 2022-01-09 ENCOUNTER — Encounter: Payer: Self-pay | Admitting: Family Medicine

## 2022-01-09 ENCOUNTER — Other Ambulatory Visit: Payer: Self-pay

## 2022-01-09 VITALS — BP 102/60 | HR 71 | Wt 151.0 lb

## 2022-01-09 DIAGNOSIS — Z01818 Encounter for other preprocedural examination: Secondary | ICD-10-CM | POA: Diagnosis not present

## 2022-01-09 HISTORY — DX: Encounter for other preprocedural examination: Z01.818

## 2022-01-09 LAB — POCT UA - MICROSCOPIC ONLY: Epithelial cells, urine per micros: >20

## 2022-01-09 LAB — POCT URINE PREGNANCY: Preg Test, Ur: NEGATIVE

## 2022-01-09 NOTE — Assessment & Plan Note (Signed)
Patient planning for breast implants in late May with cosmetic surgery clinic in [place].  Patient requests preoperative labs, presents list on phone from surgeon.  Today we were able to order ABO, CBC, CMP, A1c, hep C, HIV, RPR, lipid panel, UA, urine pregnancy test. I did not order PT, TPT, HBsAg, TGO, TGP, LDH, or D-dimer. I am unsure what PT, TPT, TGO, and TGP are. Patient is already vaccinated for Hepatitis B. Did not order LDH or D-dimer as she is not currently ill, has no vital sign abnormalities, and does not have a history of a clotting disorder.  Patient signed records release so she may get copies of these after they are resulted. ?

## 2022-01-09 NOTE — Progress Notes (Signed)
? ?  SUBJECTIVE:  ? ?CHIEF COMPLAINT / HPI:  ? ?Cosmetic surgery ?- patient needs pre-op labs and clearance for planned breast implants in a couple months ?- surgery scheduled for May 28th with Dr. Barbaraann Boys [facility] in [place] ?- history of open heart surgery, saw cardiology for pre-op clearance already ?- no prior issues with anesthesia ?- no HTN, COPD, sleep apnea ?- asthma (as child) but none as adult ?- patient presents the following list on her phone of the following labs she is requested to undergo prior to surgery (in a mix of Spanish and [place]): ?Hemogram ?Urea ?Creatinine ?PT  ?TPT  ?Glicemia ?Typification - blood type ?Vdrl ?HIV ?HCV ?Hbsag ?B hcg ?TGO ?TGP ?Trig ?Chol ?LDL ?LDH ?D dimer ?Urine test ? ?PERTINENT  PMH / PSH:  ?Patient Active Problem List  ? Diagnosis Date Noted  ? Pre-operative clearance 01/09/2022  ? Dysmenorrhea 11/18/2021  ? Iron deficiency anemia 05/17/2020  ? S/P surgical atrial septal defect closure 05/17/2020  ? Heart murmur 02/14/2015  ? Allergic conjunctivitis and rhinitis 02/14/2015  ? Encounter for preconception consultation 02/14/2015  ?  ?OBJECTIVE:  ? ?BP 102/60   Pulse 71   Wt 151 lb (68.5 kg)   LMP 11/16/2021 Comment: stopped Depo after 11/16/2021  SpO2 99%   BMI 25.92 kg/m?   ? ?PHQ-9:  ?Depression screen [facility] 2/9 01/09/2022 11/17/2021 05/17/2020  ?Decreased Interest 0 0 2  ?Down, Depressed, Hopeless 0 0 0  ?PHQ - 2 Score 0 0 2  ?Altered sleeping 0 2 2  ?Tired, decreased energy 0 2 1  ?Change in appetite 0 1 1  ?Feeling bad or failure about yourself  0 0 0  ?Trouble concentrating 0 0 0  ?Moving slowly or fidgety/restless 0 0 0  ?Suicidal thoughts 0 0 0  ?PHQ-9 Score 0 5 6  ?Difficult doing work/chores Not difficult at all Not difficult at all -  ?  ?GAD-7: No flowsheet data found. ?  ?Physical Exam ?General: Awake, alert, oriented ?HEENT: Mallampati 3 ?Cardiovascular: Regular rate and rhythm, S1 and S2 present, no murmurs auscultated ?Respiratory: Lung  fields clear to auscultation bilaterally ? ?ASSESSMENT/PLAN:  ? ?Pre-operative clearance ?Patient planning for breast implants in late May with cosmetic surgery clinic in [place].  Patient requests preoperative labs, presents list on phone from surgeon.  Today we were able to order ABO, CBC, CMP, A1c, hep C, HIV, RPR, lipid panel, UA, urine pregnancy test. I did not order PT, TPT, HBsAg, TGO, TGP, LDH, or D-dimer. I am unsure what PT, TPT, TGO, and TGP are. Patient is already vaccinated for Hepatitis B. Did not order LDH or D-dimer as she is not currently ill, has no vital sign abnormalities, and does not have a history of a clotting disorder.  Patient signed records release so she may get copies of these after they are resulted. ?  ? ? ?Fayette Pho, MD ?[facility]  ?

## 2022-01-09 NOTE — Patient Instructions (Addendum)
I have translated the following text using Google translate.  As such, there are many errors.  I apologize for the poor written translation; however, we do not have written  translation services yet. It was wonderful to meet you today. Thank you for allowing me to be a part of your care. Below is a short summary of what we discussed at your visit today: ? ?Preoperative clearance ?You signed paper allowing [place] to release these records.  I will submit this paperwork to the medical records department as soon as your labs result. Today, we collected the following labs:  ? CBC (hemoglobin) ?CMP (will have creatinine, urea [place]) ?Hemoglobin A1c (blood sugar screening) ?Blood type ?HIV, hepatitis C, RPR/syphilis ?Urine pregnancy test ?Lipid panel (will have triglycerides, cholesterol, LDL) ? ?Today, we were not able to collect the following labs: ?PT ?TPT ?HBsAg ?TGO ?TGP ?LDH ?D-dimer ? ?Health Maintenance ?We like to think about ways to keep you healthy for years to come. Below are some interventions and screenings we can offer to keep you healthy: ?- PAP smear (please schedule at your convenience) ?- HPV vaccines (to protect against cervical cancer) ? ? ?Please bring all of your medications to every appointment! ? ?If you have any questions or concerns, please do not hesitate to contact us via phone or MyChart message.  ? ?Fayette Pho, MD  ?

## 2022-01-10 ENCOUNTER — Encounter: Payer: Self-pay | Admitting: Family Medicine

## 2022-01-10 ENCOUNTER — Other Ambulatory Visit: Payer: Medicaid Other

## 2022-01-10 DIAGNOSIS — Z01818 Encounter for other preprocedural examination: Secondary | ICD-10-CM | POA: Diagnosis not present

## 2022-01-11 ENCOUNTER — Encounter: Payer: Self-pay | Admitting: Family Medicine

## 2022-01-11 LAB — COMPREHENSIVE METABOLIC PANEL
ALT: 17 IU/L (ref 0–32)
AST: 19 IU/L (ref 0–40)
Albumin/Globulin Ratio: 1.7 (ref 1.2–2.2)
Albumin: 4.4 g/dL (ref 3.9–5.0)
Alkaline Phosphatase: 102 IU/L (ref 44–121)
BUN/Creatinine Ratio: 20 (ref 9–23)
BUN: 11 mg/dL (ref 6–20)
Bilirubin Total: 0.6 mg/dL (ref 0.0–1.2)
CO2: 20 mmol/L (ref 20–29)
Calcium: 9.2 mg/dL (ref 8.7–10.2)
Chloride: 105 mmol/L (ref 96–106)
Creatinine, Ser: 0.55 mg/dL — ABNORMAL LOW (ref 0.57–1.00)
Globulin, Total: 2.6 g/dL (ref 1.5–4.5)
Glucose: 76 mg/dL (ref 70–99)
Potassium: 4.1 mmol/L (ref 3.5–5.2)
Sodium: 138 mmol/L (ref 134–144)
Total Protein: 7 g/dL (ref 6.0–8.5)
eGFR: 134 mL/min/{1.73_m2} (ref >59)

## 2022-01-11 LAB — HEMOGLOBIN A1C
Est. average glucose Bld gHb Est-mCnc: 85 mg/dL
Hgb A1c MFr Bld: 4.6 % — ABNORMAL LOW (ref 4.8–5.6)

## 2022-01-11 LAB — LIPID PANEL
Chol/HDL Ratio: 3.3 ratio (ref 0.0–4.4)
Cholesterol, Total: 154 mg/dL (ref 100–199)
HDL: 47 mg/dL (ref >39)
LDL Chol Calc (NIH): 97 mg/dL (ref 0–99)
Triglycerides: 46 mg/dL (ref 0–149)
VLDL Cholesterol Cal: 10 mg/dL (ref 5–40)

## 2022-01-11 LAB — CBC
Hematocrit: 40.6 % (ref 34.0–46.6)
Hemoglobin: 13.8 g/dL (ref 11.1–15.9)
MCH: 28.8 pg (ref 26.6–33.0)
MCHC: 34 g/dL (ref 31.5–35.7)
MCV: 85 fL (ref 79–97)
Platelets: 270 10*3/uL (ref 150–450)
RBC: 4.8 x10E6/uL (ref 3.77–5.28)
RDW: 12.9 % (ref 11.7–15.4)
WBC: 8.2 10*3/uL (ref 3.4–10.8)

## 2022-01-11 LAB — HCV AB W REFLEX TO QUANT PCR: HCV Ab: NONREACTIVE

## 2022-01-11 LAB — RPR: RPR Ser Ql: NONREACTIVE

## 2022-01-11 LAB — HCV INTERPRETATION

## 2022-01-11 LAB — HIV ANTIBODY (ROUTINE TESTING W REFLEX): HIV Screen 4th Generation wRfx: NONREACTIVE

## 2022-09-13 IMAGING — DX DG CHEST 2V
2 series · 2 of 2 positions shown · non-contrast
Comparison: None.

CLINICAL DATA: Chest, palpitations

EXAM:
CHEST - 2 VIEW

[chest pa]
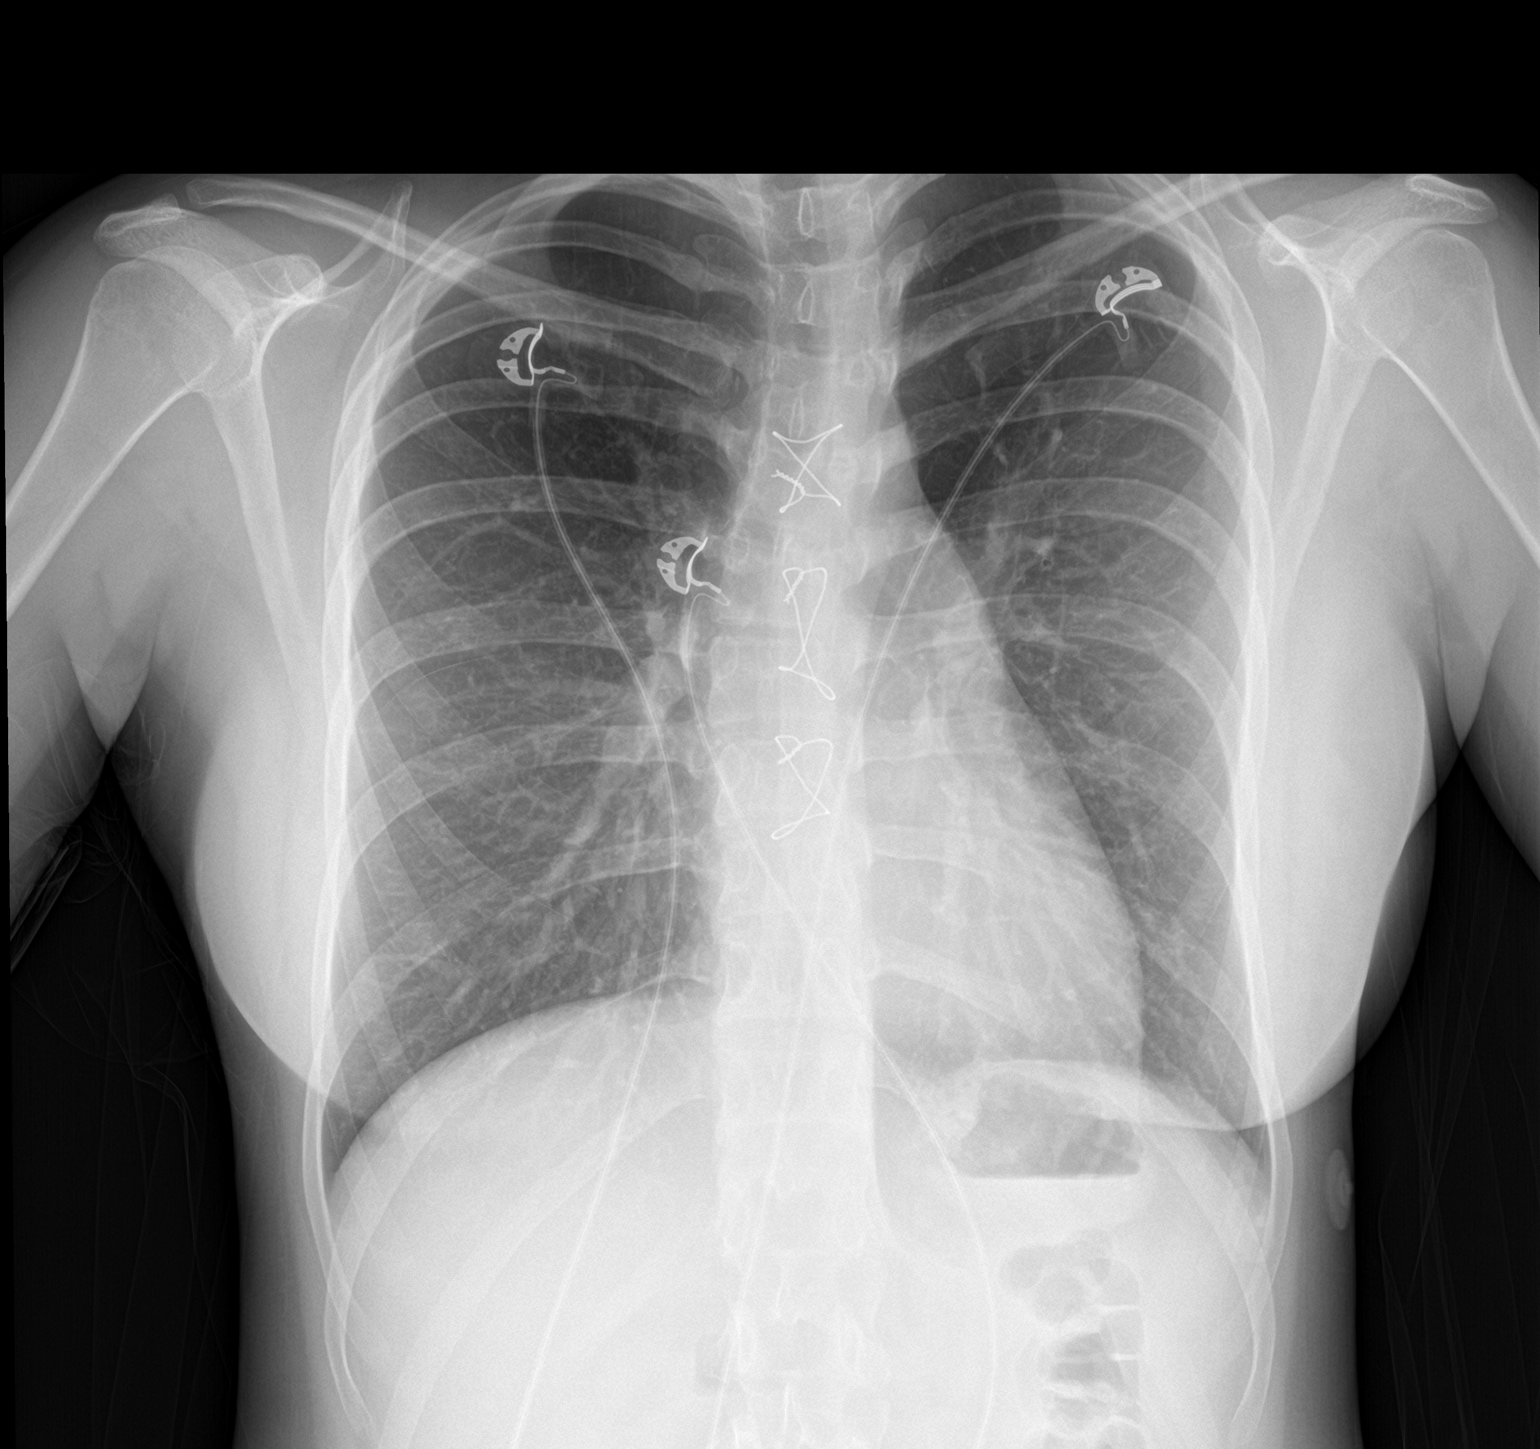

[chest lat]
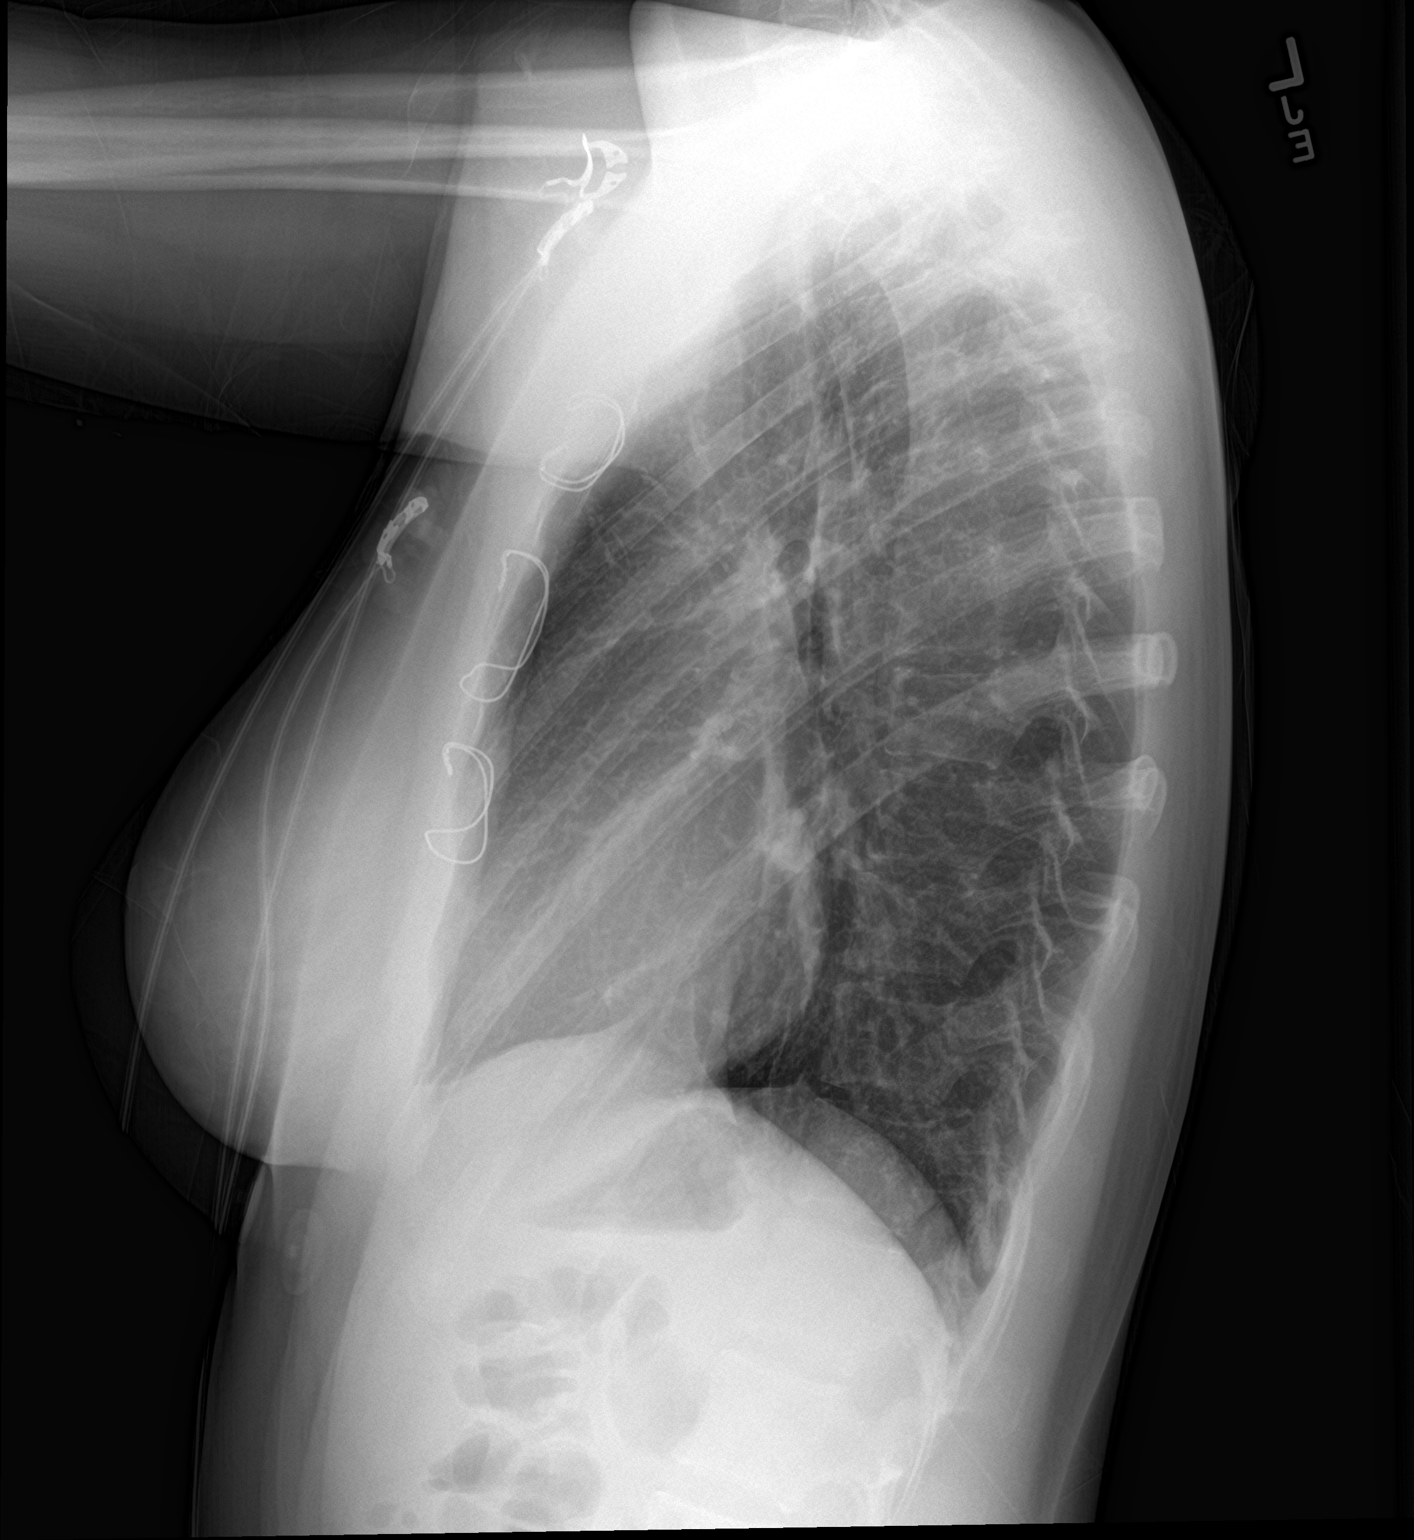

[2 of 2 positions shown; findings below may reference images not displayed]

FINDINGS: Prior median sternotomy. Heart and mediastinal contours are within
normal limits. No focal opacities or effusions. No acute bony
abnormality.
IMPRESSION: No active cardiopulmonary disease.

## 2022-11-01 ENCOUNTER — Ambulatory Visit: Payer: Medicaid Other | Admitting: Student

## 2022-11-05 ENCOUNTER — Ambulatory Visit: Payer: Medicaid Other | Admitting: Family Medicine

## 2022-11-05 ENCOUNTER — Encounter: Payer: Self-pay | Admitting: Family Medicine

## 2022-11-05 ENCOUNTER — Other Ambulatory Visit (HOSPITAL_COMMUNITY)
Admission: RE | Admit: 2022-11-05 | Discharge: 2022-11-05 | Disposition: A | Discharge status: Home / Self Care | Payer: Medicaid Other | Source: Ambulatory Visit | Attending: Family Medicine | Admitting: Family Medicine

## 2022-11-05 VITALS — BP 110/60 | HR 74 | Ht 64.0 in | Wt 142.0 lb

## 2022-11-05 DIAGNOSIS — Z3169 Encounter for other general counseling and advice on procreation: Secondary | ICD-10-CM | POA: Diagnosis not present

## 2022-11-05 DIAGNOSIS — Z124 Encounter for screening for malignant neoplasm of cervix: Secondary | ICD-10-CM | POA: Insufficient documentation

## 2022-11-05 DIAGNOSIS — D508 Other iron deficiency anemias: Secondary | ICD-10-CM | POA: Diagnosis not present

## 2022-11-05 NOTE — Patient Instructions (Addendum)
It was wonderful to meet you today. Thank you for allowing me to be a part of your care. Below is a short summary of what we discussed at your visit today:  Preconception counseling Start taking the prenatal vitamin.  Keep up the healthy diet and habits! We took some blood work today. If the results are normal, I will send you a letter or MyChart message. If the results are abnormal, I will give you a call.    Book that ultrasound to evaluate your uterus. You can call the [facility] scheduling desk at [phone].   PAP smear Today we also performed a PAP smear prior to your colposcopy procedure. Results as above.   STD testing You requested STD testing today. We swabbed your vagina to test for gonorrhea, chlamydia, trichomonas. For normal results, we will either send you a MyChart message or a letter. If there are any abnormal results, we will call you with the results and a plan going forward.    If you have any questions or concerns, please do not hesitate to contact us via phone or MyChart message.   Ezequiel Essex, MD

## 2022-11-05 NOTE — Assessment & Plan Note (Signed)
Obtained first lifetime Pap today.  Will follow results.  No concerning symptoms, physical exam unremarkable.

## 2022-11-05 NOTE — Assessment & Plan Note (Signed)
Discussed starting prenatal vitamin and tracking periods closely.  Patient will call [facility] scheduling to reschedule her TVUS initially ordered January 2023 (although not yet >1-year, so she can reschedule her self without new order).  We will recollect some labs today including A1c, CBC, TSH, HIV, and RPR. I expect all of these to be normal and negative.  Given she is now having very regular periods, normal BMI, and healthy lifestyle, I do not suspect any underlying PCOS, endocrine disorder, or structural abnormality that would prevent her from conceiving.  Will follow results.

## 2022-11-05 NOTE — Progress Notes (Signed)
    SUBJECTIVE:   CHIEF COMPLAINT / HPI:   Painful periods, pregnancy desired TVUS ordered Jan 2023, not yet done Labs March 2023: A1c 4.6 Hep C, RPR, HIV negative Lipids - LDL 97, total 154, HDL 47 CBC, CMP wnl Menstrual history: Prior to open heart surgery 7 years ago, her periods are very painful and heavy.  She was anemic because of her heavy bleeding. Started on Depo right after heart surgery, was on this for several years with no menses or associated pain. Stopped Depo couple years ago.  It took about 1 year off Depo to regain her menses.  It was irregular at first, but has not become quite regular, on the 17th of every month.  Menses are painful but not as bad as before. Not currently on prenatal vitamin, but will start now Has been having unprotected intercourse but not timed with ovulation or planned Would like to obtain PAP smear today  PERTINENT  PMH / PSH:  Patient Active Problem List   Diagnosis Date Noted   Cervical cancer screening 11/05/2022   Dysmenorrhea 11/18/2021   Iron deficiency anemia 05/17/2020   S/P surgical atrial septal defect closure 05/17/2020   Heart murmur 02/14/2015   Allergic conjunctivitis and rhinitis 02/14/2015   Encounter for preconception consultation 02/14/2015    OBJECTIVE:   BP 110/60   Pulse 74   Ht 5\' 4"  (1.626 m)   Wt 142 lb (64.4 kg)   LMP 10/07/2022 (Exact Date)   SpO2 98%   BMI 24.37 kg/m    Physical Exam General: Awake, alert, oriented, no acute distress Respiratory: Normal work of breathing, no respiratory distress Neuro: Cranial nerves II through X grossly intact, able to move all extremities spontaneously Vulva: Normal appearing vulva without rashes, lesions, or deformities Vagina: Pale pink rugated vaginal tissue without obvious lesions, physiologic discharge of whitish color, cervix without lesion or overt tenderness with swab  Sensitive exam performed with chaperone in the room:  Andreas Newport,  CMA  ASSESSMENT/PLAN:   Cervical cancer screening Obtained first lifetime Pap today.  Will follow results.  No concerning symptoms, physical exam unremarkable.  Encounter for preconception consultation Discussed starting prenatal vitamin and tracking periods closely.  Patient will call  radiology scheduling to reschedule her TVUS initially ordered January 2023 (although not yet >1-year, so she can reschedule her self without new order).  We will recollect some labs today including A1c, CBC, TSH, HIV, and RPR. I expect all of these to be normal and negative.  Given she is now having very regular periods, normal BMI, and healthy lifestyle, I do not suspect any underlying PCOS, endocrine disorder, or structural abnormality that would prevent her from conceiving.  Will follow results.    Ezequiel Essex, MD [place]

## 2022-11-06 LAB — CYTOLOGY - PAP
Adequacy: ABSENT
Chlamydia: NEGATIVE
Comment: NEGATIVE
Comment: NEGATIVE
Comment: NORMAL
Diagnosis: NEGATIVE
Neisseria Gonorrhea: NEGATIVE
Trichomonas: NEGATIVE

## 2024-03-31 ENCOUNTER — Encounter: Payer: Self-pay | Admitting: *Deleted
